# Patient Record
Sex: Male | Born: 1960 | Race: White | Hispanic: No | Marital: Single | Smoking: Current every day smoker
Health system: Southern US, Community
[De-identification: ages and names within clinical notes are randomized; demographics above are authoritative.]

## PROBLEM LIST (undated history)

## (undated) DIAGNOSIS — F3181 Bipolar II disorder: Secondary | ICD-10-CM

## (undated) DIAGNOSIS — IMO0002 Reserved for concepts with insufficient information to code with codable children: Secondary | ICD-10-CM

## (undated) HISTORY — PX: APPENDECTOMY: SHX54

---

## 2002-02-10 ENCOUNTER — Inpatient Hospital Stay (HOSPITAL_COMMUNITY): Admission: EM | Admit: 2002-02-10 | Discharge: 2002-02-12 | Payer: Self-pay | Admitting: *Deleted

## 2003-08-01 ENCOUNTER — Emergency Department (HOSPITAL_COMMUNITY): Admission: EM | Admit: 2003-08-01 | Discharge: 2003-08-01 | Payer: Self-pay | Admitting: Family Medicine

## 2004-10-12 ENCOUNTER — Ambulatory Visit: Payer: Self-pay | Admitting: Internal Medicine

## 2004-10-15 ENCOUNTER — Ambulatory Visit: Payer: Self-pay | Admitting: Internal Medicine

## 2004-10-21 ENCOUNTER — Ambulatory Visit: Payer: Self-pay | Admitting: Internal Medicine

## 2005-01-01 ENCOUNTER — Ambulatory Visit: Payer: Self-pay | Admitting: Internal Medicine

## 2005-01-21 ENCOUNTER — Ambulatory Visit: Payer: Self-pay | Admitting: Internal Medicine

## 2005-04-30 ENCOUNTER — Ambulatory Visit: Payer: Self-pay | Admitting: Internal Medicine

## 2005-05-04 ENCOUNTER — Encounter: Admission: RE | Admit: 2005-05-04 | Discharge: 2005-05-04 | Payer: Self-pay | Admitting: Internal Medicine

## 2005-05-13 ENCOUNTER — Ambulatory Visit: Payer: Self-pay | Admitting: Cardiology

## 2006-03-06 ENCOUNTER — Emergency Department (HOSPITAL_COMMUNITY): Admission: EM | Admit: 2006-03-06 | Discharge: 2006-03-06 | Payer: Self-pay | Admitting: Family Medicine

## 2006-11-24 IMAGING — CR DG CHEST 2V
2 series · 2 of 2 positions shown · non-contrast
Comparison: None.

CLINICAL DATA: Routine physical.  Former smoker. 
 TWO VIEW CHEST:

[w chest pa]
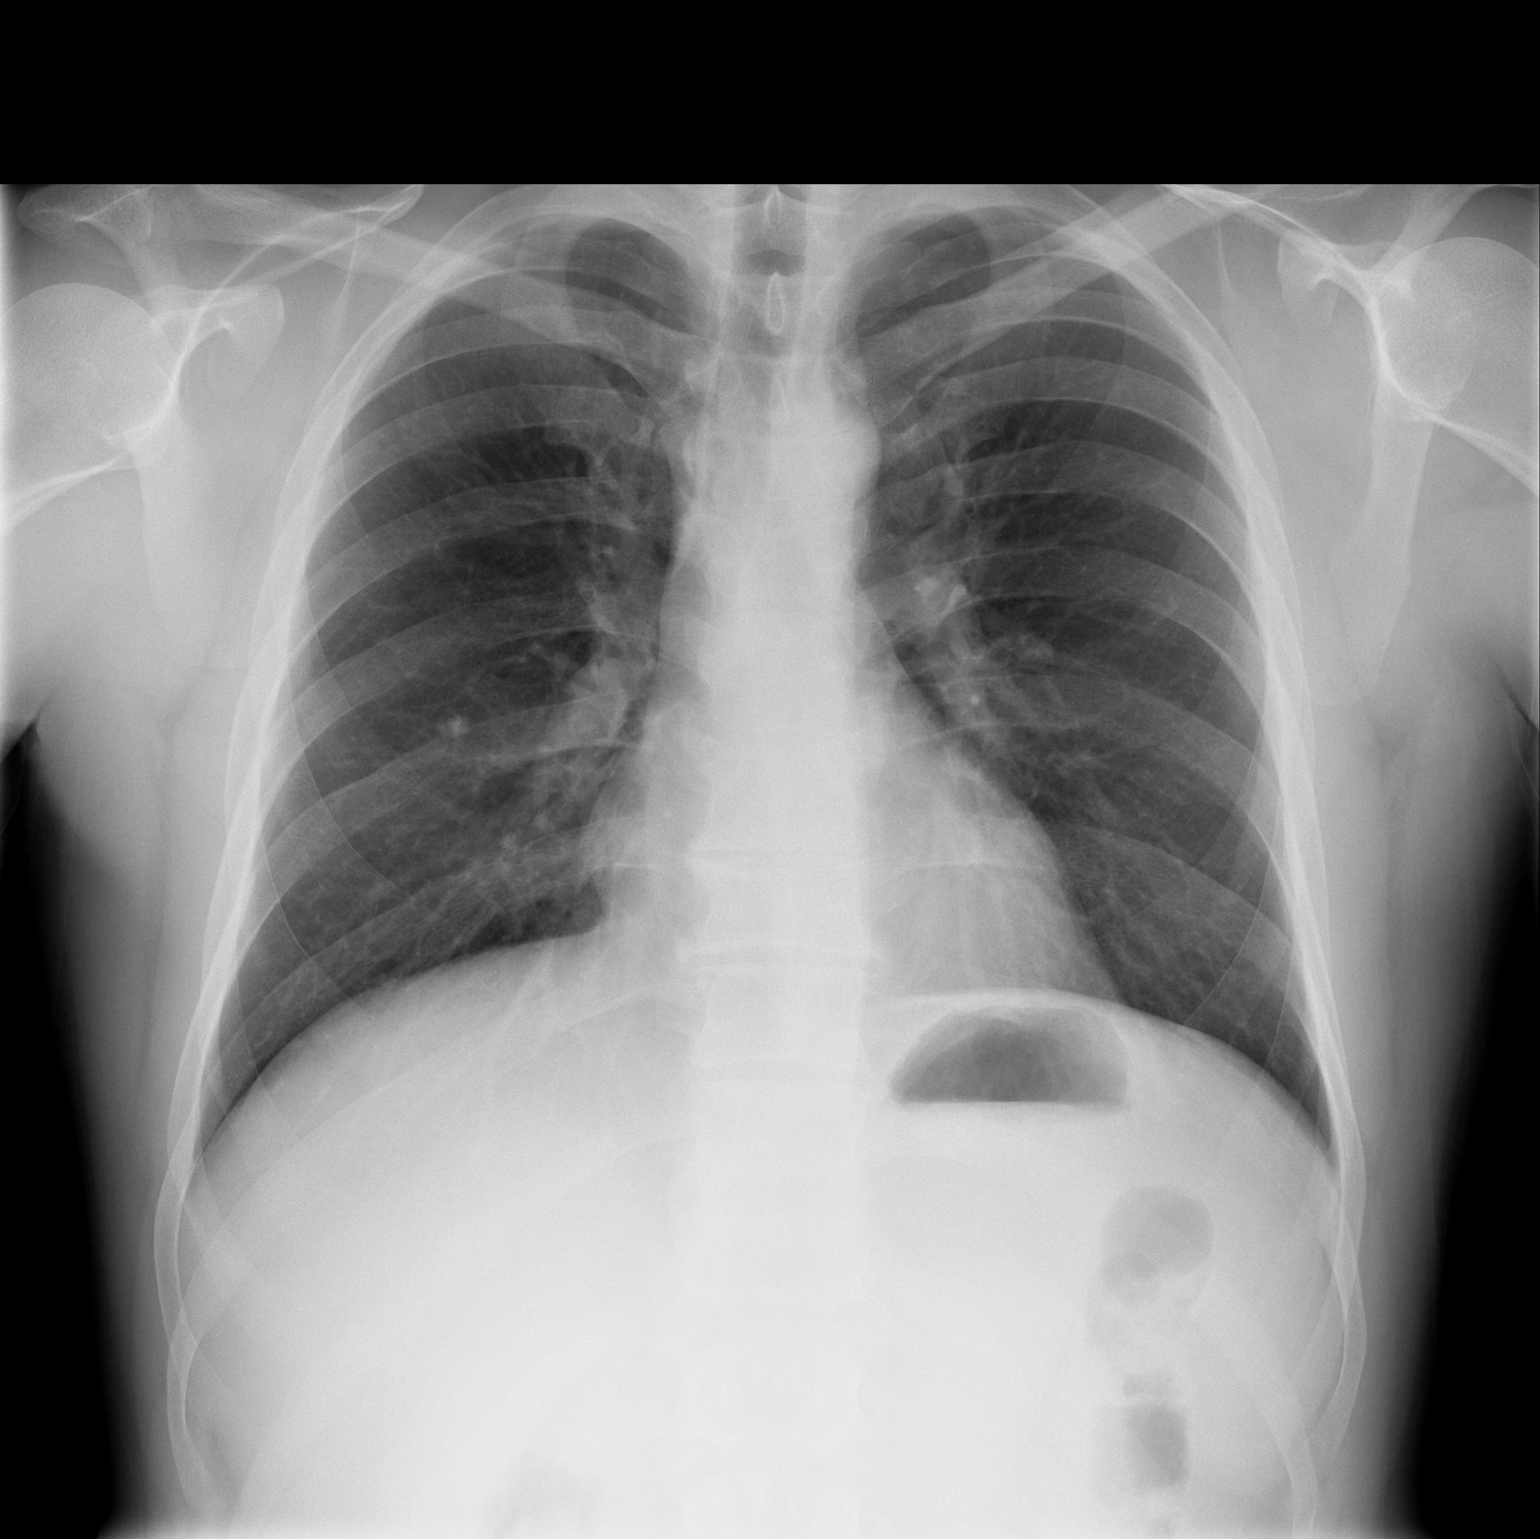

[w chest lat]
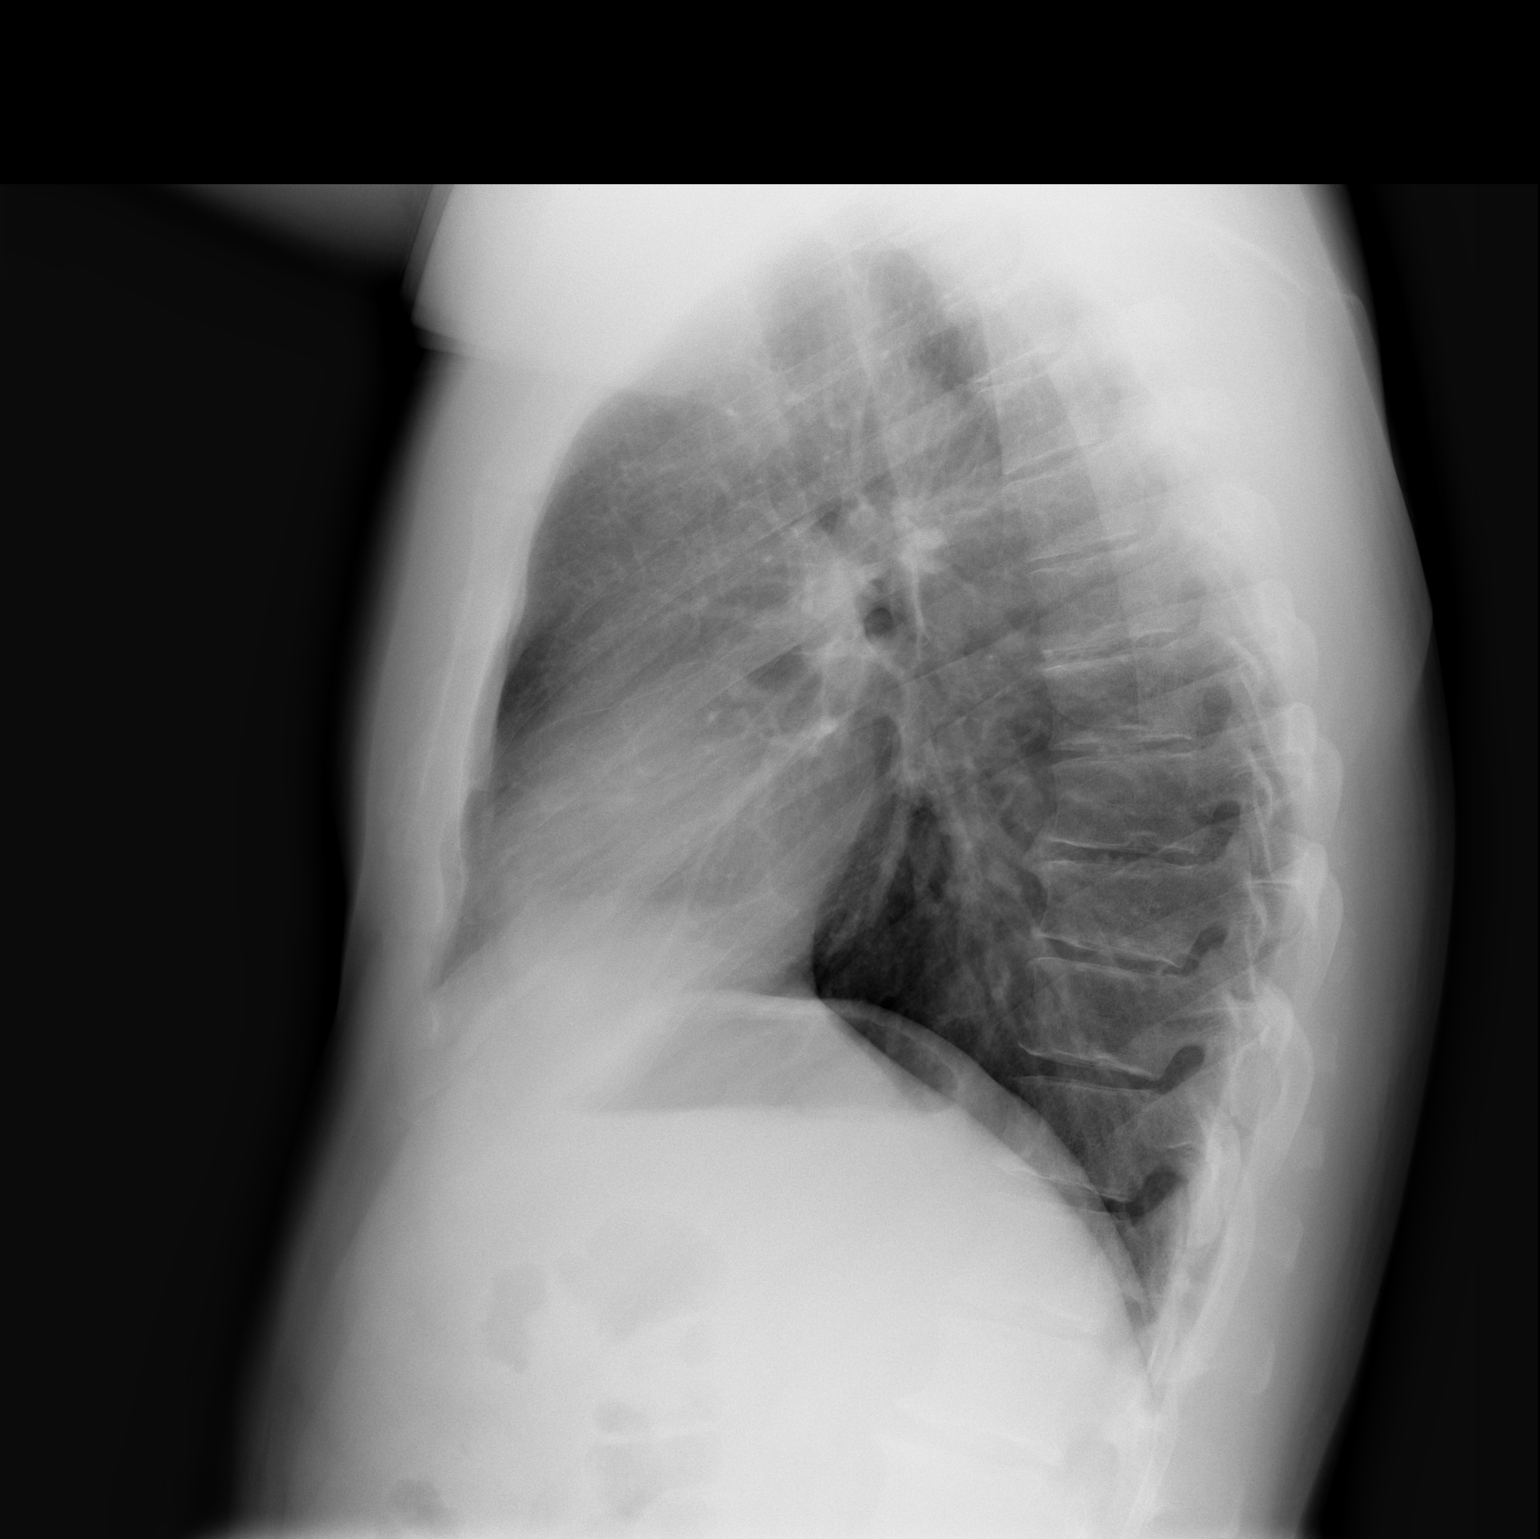

[2 of 2 positions shown; findings below may reference images not displayed]

FINDINGS: 10mm nodule is identified within the right mid lung.  A CT of the chest is recommended for further evaluation.  
 Remaining lungs are clear.  Heart size is normal.  No effusions or edema.
IMPRESSION: Right mid lung nodule.  Recommend CT chest.

## 2006-12-03 IMAGING — CT CT CHEST WO/W CM
2 of 7 series · 12 of 36 positions shown, 15 images · IV contrast (omnipaque)
Comparison: Chest radiograph on 05/04/05.

CLINICAL DATA: Right lung nodule seen on chest radiograph. Smoker. 
 CHEST CT WITHOUT AND WITH CONTRAST:
TECHNIQUE: Multidetector CT imaging of the chest was performed following the standard protocol before and during bolus administration of intravenous contrast.
 Contrast:  80 cc Omnipaque 300.

[Series 2: chest_routine 5.0 b40f st · axial · 0.66mm/px · z∈[-362,-87]mm · 11 of 67 slices shown, 14 images]
[im 6/67  mediastinal]
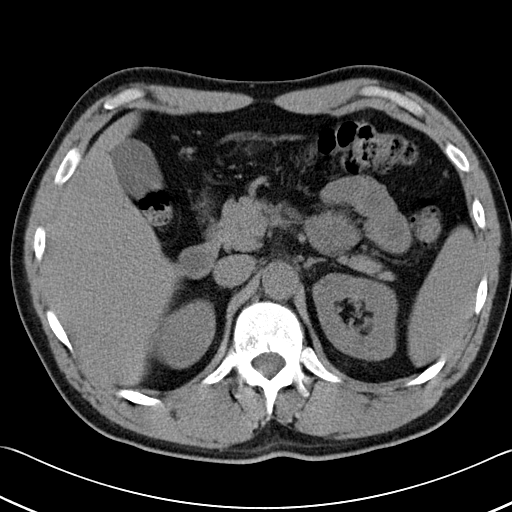
[im 6/67  lung]
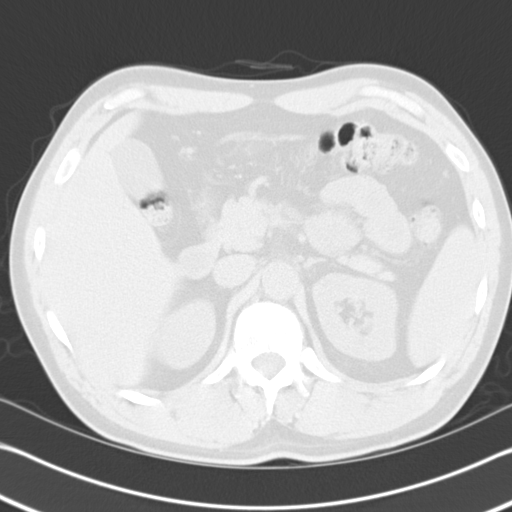
[im 12/67  lung]
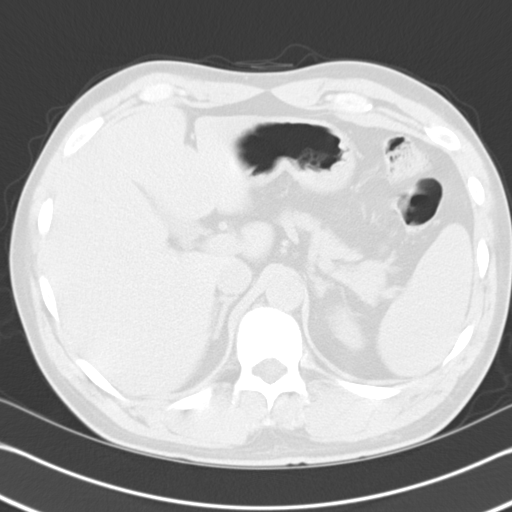
[im 17/67  lung]
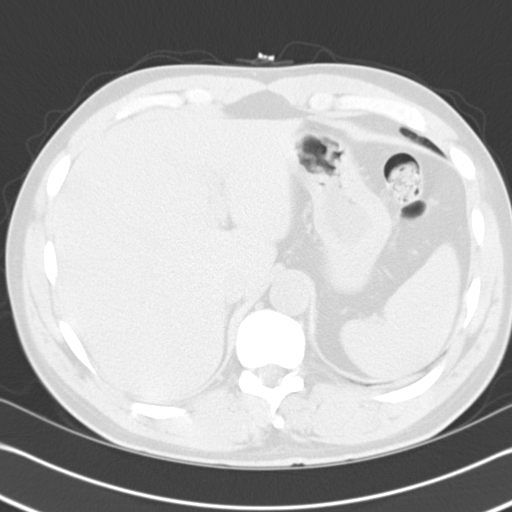
[im 23/67  lung]
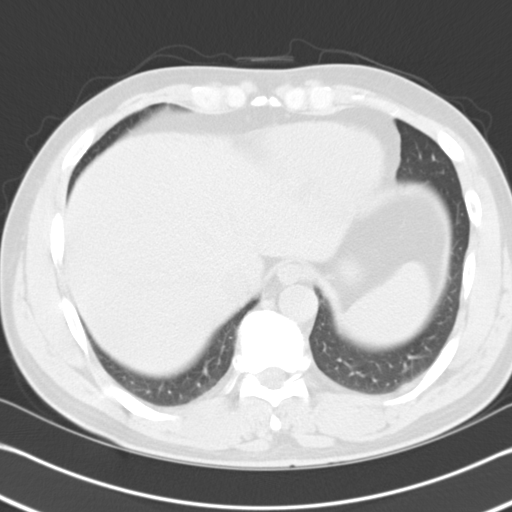
[im 28/67  mediastinal]
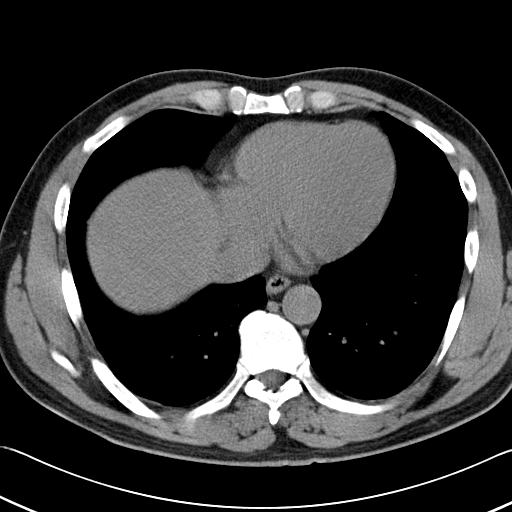
[im 28/67  lung]
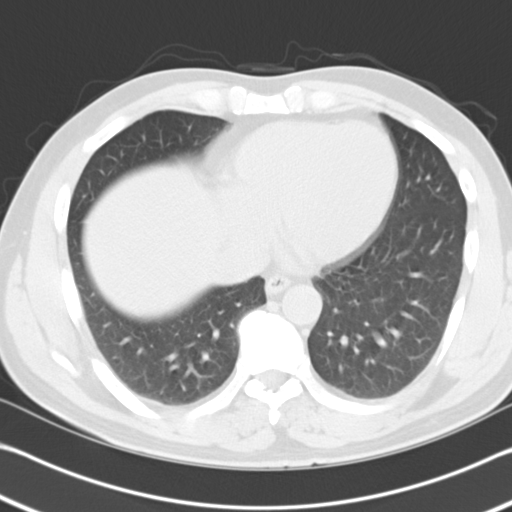
[im 34/67  lung]
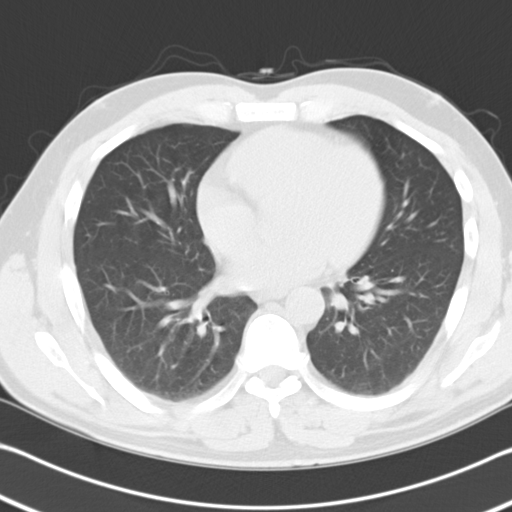
[im 39/67  lung]
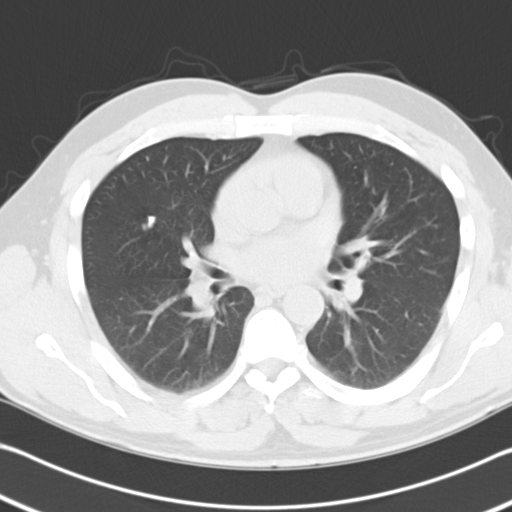
[im 45/67  lung]
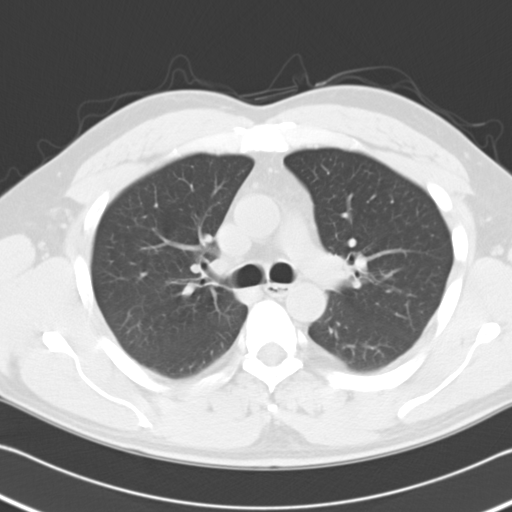
[im 50/67  mediastinal]
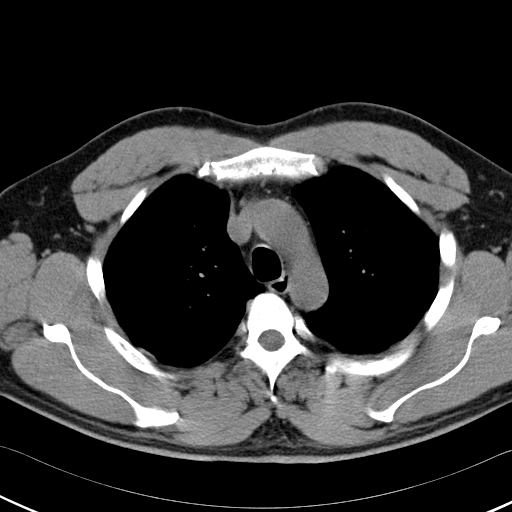
[im 50/67  lung]
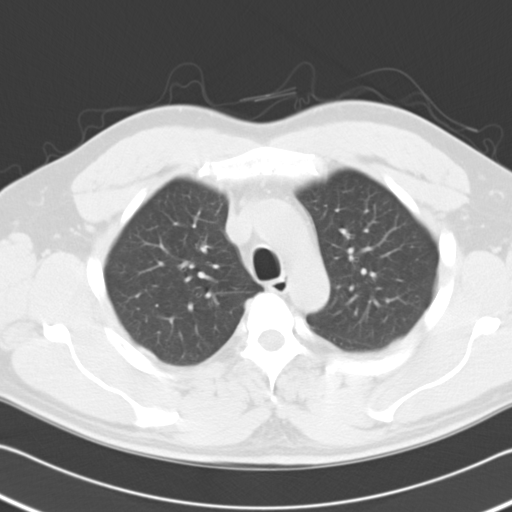
[im 56/67  lung]
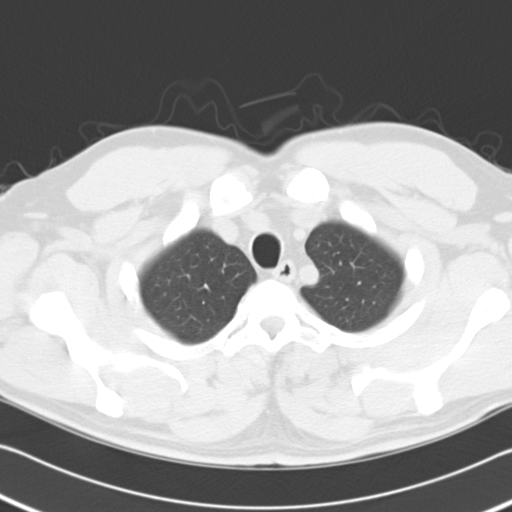
[im 61/67  lung]
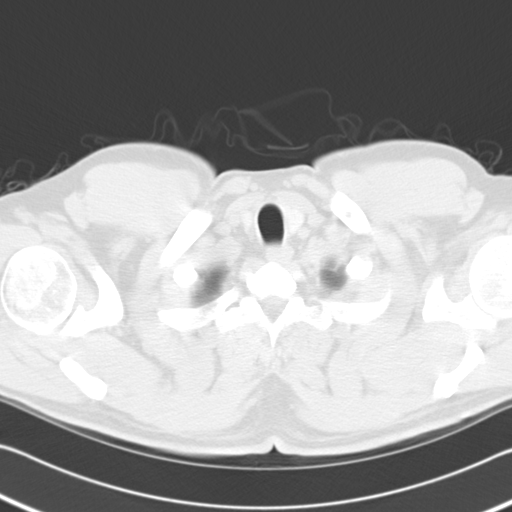

[Series 602: coronal chest · coronal · 0.68mm/px · 1 of 48 slices shown]
[im 24/48  lung]
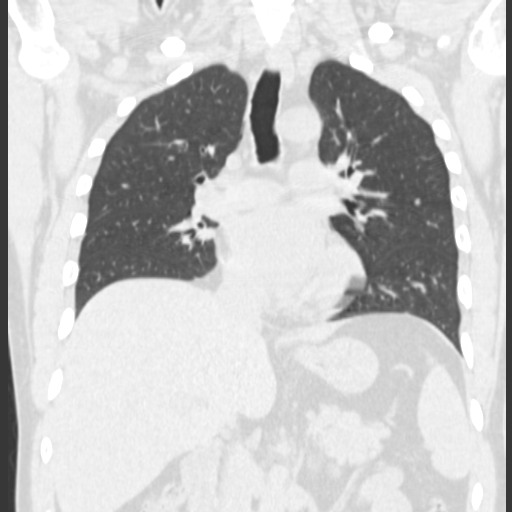

[12 of 36 positions shown; findings below may reference images not displayed]

FINDINGS: A calcified granuloma is seen in the right middle lobe which corresponds to the nodule seen on recent chest radiograph.  No noncalcified pulmonary nodules or masses are identified. There is no evidence of pulmonary infiltrate.  
 There is no evidence of pleural or pericardial effusion. There is no evidence of hilar or mediastinal masses or adenopathy within the thorax.  Images obtained through the upper abdomen to the level of the adrenal glands are unremarkable.
IMPRESSION: 1.  Calcified granuloma in the right middle lobe, corresponding with the nodular opacity seen on chest radiograph. 
 2.  No evidence of mass or other active disease.

## 2014-06-15 ENCOUNTER — Encounter (HOSPITAL_COMMUNITY): Payer: Self-pay | Admitting: *Deleted

## 2014-06-15 ENCOUNTER — Emergency Department (HOSPITAL_COMMUNITY)
Admission: EM | Admit: 2014-06-15 | Discharge: 2014-06-15 | Disposition: A | Discharge status: Home / Self Care | Payer: Self-pay | Attending: Emergency Medicine | Admitting: Emergency Medicine

## 2014-06-15 DIAGNOSIS — Z8669 Personal history of other diseases of the nervous system and sense organs: Secondary | ICD-10-CM | POA: Insufficient documentation

## 2014-06-15 DIAGNOSIS — Z720 Tobacco use: Secondary | ICD-10-CM | POA: Insufficient documentation

## 2014-06-15 DIAGNOSIS — Z8659 Personal history of other mental and behavioral disorders: Secondary | ICD-10-CM | POA: Insufficient documentation

## 2014-06-15 DIAGNOSIS — F419 Anxiety disorder, unspecified: Secondary | ICD-10-CM | POA: Insufficient documentation

## 2014-06-15 HISTORY — DX: Reserved for concepts with insufficient information to code with codable children: IMO0002

## 2014-06-15 NOTE — ED Notes (Signed)
The pt is bi-polar and for 7 months he has been out of his meds.  His doctor retired

## 2014-06-15 NOTE — ED Provider Notes (Signed)
CSN: [id number]     Arrival date & time 06/15/14  1642 History  This chart was scribed for non-physician practitioner, Will Marijo Fileansie, PA-C working with Purvis SheffieldForrest Harrison, MD by Angelene GiovanniEmmanuella Mensah, ED Scribe. The patient was seen in room TR05C/TR05C and the patient's care was started at 7:10 PM    Chief Complaint  Patient presents with  . Medication Refill   The history is provided by the patient. No language interpreter was used.   HPI Comments: Donald HammersRandal Gross is a 54 y.o. male who presents to the Emergency Department for medication refill. He reports that he has been off the medication for 9 months and last night, he had an outburst and would like to be placed back on them. He reports yesterday he yelled at a friend when he got upset. He denies any SI or HI at that time. He reports that he was seeing Dr. Delanna AhmadiAlex Meyers for his psych needs but he has since retired. He reports taking Risperidone 2 mg and Lamotrigine 100 mg. He denies HI/SI. He reports anxiety especially about his outburst yesterday. He denies paranoia or hallucinations. He reports that he has been sleeping well. He denies any fever, chills, cough, CP, abdominal pain, N/V/D, sore throat, or nasal congestion.   Past Medical History  Diagnosis Date  . Bilateral posterior subcapsular polar cataract    History reviewed. No pertinent past surgical history. No family history on file. History  Substance Use Topics  . Smoking status: Current Every Day Smoker  . Smokeless tobacco: Not on file  . Alcohol Use: Yes    Review of Systems  Constitutional: Negative for fever and chills.  HENT: Negative for congestion and sore throat.   Respiratory: Negative for cough.   Cardiovascular: Negative for chest pain.  Gastrointestinal: Negative for nausea, vomiting, abdominal pain and diarrhea.  Skin: Negative for rash.  Psychiatric/Behavioral: Negative for suicidal ideas, hallucinations, confusion, sleep disturbance, dysphoric mood and agitation. The  patient is nervous/anxious. The patient is not hyperactive.       Allergies  Review of patient's allergies indicates no known allergies.  Home Medications   Prior to Admission medications   Not on File   BP 165/85 mmHg  Pulse 84  Temp(Src) 98.2 F (36.8 C)  Resp 20  Ht 5' 11.5" (1.816 m)  Wt 191 lb (86.637 kg)  BMI 26.27 kg/m2  SpO2 95% Physical Exam  Constitutional: He is oriented to person, place, and time. He appears well-developed and well-nourished. No distress.  Nontoxic appearing. Good eye contact while speaking.   HENT:  Head: Normocephalic and atraumatic.  Mouth/Throat: No oropharyngeal exudate.  Eyes: Conjunctivae are normal. Right eye exhibits no discharge. Left eye exhibits no discharge.  Neck: Neck supple. No tracheal deviation present.  Cardiovascular: Normal rate, regular rhythm, normal heart sounds and intact distal pulses.   Pulmonary/Chest: Effort normal and breath sounds normal. No respiratory distress. He has no wheezes. He has no rales.  Abdominal: Soft. There is no tenderness.  Musculoskeletal: Normal range of motion.  Lymphadenopathy:    He has no cervical adenopathy.  Neurological: He is alert and oriented to person, place, and time. Coordination normal.  Skin: Skin is warm and dry. No rash noted. He is not diaphoretic. No erythema. No pallor.  Psychiatric: His behavior is normal. Thought content normal. His mood appears anxious. His affect is not angry, not blunt, not labile and not inappropriate. His speech is not rapid and/or pressured, not tangential and not slurred. He is not agitated,  not aggressive, not hyperactive and not actively hallucinating. Thought content is not paranoid and not delusional. Cognition and memory are not impaired. He does not exhibit a depressed mood. He expresses no homicidal and no suicidal ideation.  Patient appears slightly anxious. He denies suicidal or homicidal ideations. He reports he has been sleeping well. He denies  paranoia. He makes good eye contact while speaking. He is alert and oriented 3. He is attentive.  Nursing note and vitals reviewed.   ED Course  Procedures (including critical care time) DIAGNOSTIC STUDIES: Oxygen Saturation is 96% on RA, adequate by my interpretation.    COORDINATION OF CARE: 7:17 PM- Pt advised of plan for treatment and pt agrees.    Labs Review Labs Reviewed - No data to display  Imaging Review No results found.   EKG Interpretation None      Filed Vitals:   06/15/14 1659 06/15/14 1923  BP: 170/91 165/85  Pulse: 91 84  Temp: 98.2 F (36.8 C)   Resp: 16 20  Height: 5' 11.5" (1.816 m)   Weight: 191 lb (86.637 kg)   SpO2: 96% 95%     MDM   Meds given in ED:  Medications - No data to display  New Prescriptions   No medications on file    Final diagnoses:  History of bipolar disorder   This is a 54 year old male with a history of bipolar disorder who presents to the emergency department requesting to be restarted on his bipolar medications. The patient has not taken any medications in the past 9 months. His former psychiatrist Dr. Delanna Ahmadi has retired. The patient is afebrile and nontoxic appearing. He makes good eye contact while speaking. He denies suicidal or homicidal ideations. He reports he's been sleeping well. He is alert and oriented 3. As the patient has been off his medications for the past 9 months he needs to be restarted on these medications by a psychiatrist. I will provide him with the appropriate information for him to follow-up with a psychiatrist in the area. He seems reasonable and willing to follow-up. He does not appear to be manic and he denies suicidal or homicidal ideations. Strict return precautions provided. I advised the patient to return to the emergency department with new or worsening symptoms or new concerns. The patient verbalized understanding and agreement with plan.   I personally performed the services  described in this documentation, which was scribed in my presence. The recorded information has been reviewed and is accurate.    Everlene Farrier, PA-C 06/15/14 1928  Purvis Sheffield, MD 06/16/14 [phone]

## 2014-06-15 NOTE — Discharge Instructions (Signed)
Substance Abuse Treatment Programs ° °Intensive Outpatient Programs °[facility]     °[address]      °[place], [place]                   °[phone]      ° °[facility] °[address] °[place], [place] °[phone] ° °[facility]     °(Inpatient and outpatient)     °[address]           °[phone]   ° °[facility] °[phone] (Suboxone and Methadone) ° °[address]      °[place], [place] [address]      °[phone]      ° °[address] °[place], [place] °[phone] ° °[facility] (Outpatient/Inpatient, [facility])    °(insurance only) [phone]      °       °[facility] (Groups & Residential) °[place], [place] °[phone] ° °   °[facility]     °[address]     °[place], [place]      °[phone]      ° °[facility] (for caregivers and family) °[address] °[place], [place] °[phone] ° ° ° ° ° °Residential Treatment Programs °[facility]      °[address], [place], [place] [address]  °[phone]      ° °[facility] °[address] [place], [place] [address] °[phone] ° °[facility]        °[phone]      ° °[facility] °[phone] ° °[facility]             °[address]                                         °[place], [place]                                                °[phone] or [phone]                              ° °[facility] [place] °[address] °[place], [address] °[phone] ° °[facility]    °[address]      °, [place]     °[phone]      ° °[facility] °[address] °, [place] °[phone] ° °[facility]   °[address]     °[place], [place] [address]     °[phone]      °Admissions: 8am-3pm M-F ° °[facility] °[address] °[place],  [place] °[phone] ° °[facility]: Residential Program (90 Days)   °[place], [place]      °[phone] or [phone]    ° °[facility] °[place], [place] °(Walk in Hours over the weekend or by referral) ° °[facility] °[address], [place], [place] [address] °[phone] ° °Crisis Mobile: [facility]:  1-877-626-1772 (for crisis response 24 hours a day) °[facility] Hotline:      1-800-256-2452 °Outpatient Psychiatry and Counseling ° °[facility]: Mobile Crisis   Management 24 hours:  1-579-867-5796  [facility] of the [employer] sliding scale fee and walk in schedule: M-F 8am-12pm/1pm-3pm [address]  [place], [place] [address] [place], [place] [address] [phone]  [facility] ([facility])- new patient walk-in appointments available Monday - Friday 8am -3pm.          [address] [place], [place] [address] [phone] or crisis line- [facility]/ Intensive Outpatient Therapy Program [place], [place] [address] [place]      [phone] [place] [place], [place] [address]                 [place]   [facility] [phone] [place], [place] [address]   [facility]          [address] Johnette Abraham  [place], [place] [address]       [phone]  [facility] [address] [place], [place] [address] [phone]  [facility]    [address] [place], [place] [address]     [place], [place]celyn [place]     [place] [address]     [phone]       [facility] [place] [address]  [facility]     [address], [place], [place], [place] [address] [place]     [address]     [place], [place] [address]     [phone]       [facility] [address] [place], [place] [address] [phone] Resources for Temporary Residential Assistance/Crisis [facility]) M-F 8am-3pm   [address] [place], [place] [address]   [phone] Services include: laundry, barbering, support groups, case management, phone  & computer access, showers, AA/NA mtgs, mental health/substance abuse nurse, job skills class, disability information, VA assistance, spiritual classes, etc.   [facility]   [address], [place]     [place]              [employer] (women and children)       [place]. [place], [place] [address] [phone] [email or web address] for application and process Application Required  [facility]   [address]    [place] [address]     [phone]                    [place],  [address] [phone](OILNZVJK application appt.) Application Required  [facility] (women only)    [address]     [place],  [address]     [phone]  Intake starts 6pm daily Need valid ID, SSC, & Police report [employer] [address] [place], [place] [phone] Application Required  [facility] (men only)     [address]      [place], [place]     [phone]       [facility] (Pregnant women only) [address] [place], [place] [phone]  [facility]      [address]      [place], [place] [address]     [phone]             [facility] [address] [place], [place] [phone] 90 day commitment/SA/Application process  [facility](men only)     [address]     [place], [place]     [phone]       Check-in at [facility] [address] [place], [place] [address] [phone] Men/Women/Women and Children must be there by 7 pm  [facility] [place], [place] [phone]                  Please return to the emergency department with new or worsening symptoms or thoughts of wanting to hurt yourself, other people or new concerns.  Bipolar Disorder Bipolar disorder is a mental illness. The term bipolar disorder actually is used to describe a group of disorders that all share varying degrees of emotional highs and lows that can interfere with daily functioning, such as work, school, or relationships. Bipolar disorder also can lead to drug abuse, hospitalization, and suicide. The emotional highs of bipolar disorder are periods of elation or irritability and high energy. These highs can range from a mild form (hypomania) to a severe form (mania). People experiencing episodes of hypomania may appear energetic, excitable, and highly productive. People experiencing mania may behave impulsively or erratically. They often make poor decisions. They may have difficulty sleeping. The most severe episodes of mania can involve having very distorted beliefs or perceptions about the world and seeing or hearing things that are not real (psychotic delusions and hallucinations).  The emotional lows of bipolar disorder (depression) also can range from mild to severe. Severe episodes of bipolar depression can involve psychotic delusions and hallucinations. Sometimes people with bipolar disorder experience a state of mixed mood. Symptoms of hypomania or mania and  depression are both present during this mixed-mood episode. SIGNS AND SYMPTOMS There are signs and symptoms of the episodes of hypomania and mania as well as the episodes of depression. The signs and symptoms of hypomania and mania are similar but vary in severity. They include:  Inflated self-esteem or feeling of increased self-confidence.  Decreased need for sleep.  Unusual talkativeness (rapid or pressured speech) or the feeling of a need to keep talking.  Sensation of racing thoughts or constant talking, with quick shifts between topics that may or may not be related (flight of ideas).  Decreased ability to focus or concentrate.  Increased purposeful activity, such as work, studies, or social activity, or nonproductive activity, such as pacing, squirming and fidgeting, or finger and toe tapping.  Impulsive behavior and use of poor judgment, resulting in high-risk activities, such as having unprotected sex or spending excessive amounts of money. Signs and symptoms of depression include the following:   Feelings of sadness, hopelessness, or helplessness.  Frequent or  uncontrollable episodes of crying.  Lack of feeling anything or caring about anything.  Difficulty sleeping or sleeping too much.  Inability to enjoy the things you used to enjoy.   Desire to be alone all the time.   Feelings of guilt or worthlessness.  Lack of energy or motivation.   Difficulty concentrating, remembering, or making decisions.  Change in appetite or weight beyond normal fluctuations.  Thoughts of death or the desire to harm yourself. DIAGNOSIS  Bipolar disorder is diagnosed through an assessment by your caregiver. Your caregiver will ask questions about your emotional episodes. There are two main types of bipolar disorder. People with type I bipolar disorder have manic episodes with or without depressive episodes. People with type II bipolar disorder have hypomanic episodes and major  depressive episodes, which are more serious than mild depression. The type of bipolar disorder you have can make an important difference in how your illness is monitored and treated. Your caregiver may ask questions about your medical history and use of alcohol or drugs, including prescription medication. Certain medical conditions and substances also can cause emotional highs and lows that resemble bipolar disorder (secondary bipolar disorder).  TREATMENT  Bipolar disorder is a long-term illness. It is best controlled with continuous treatment rather than treatment only when symptoms occur. The following treatments can be prescribed for bipolar disorders:  Medication--Medication can be prescribed by a doctor that is an expert in treating mental disorders (psychiatrists). Medications called mood stabilizers are usually prescribed to help control the illness. Other medications are sometimes added if symptoms of mania, depression, or psychotic delusions and hallucinations occur despite the use of a mood stabilizer.  Talk therapy--Some forms of talk therapy are helpful in providing support, education, and guidance. A combination of medication and talk therapy is best for managing the disorder over time. A procedure in which electricity is applied to your brain through your scalp (electroconvulsive therapy) is used in cases of severe mania when medication and talk therapy do not work or work too slowly. Document Released: 05/31/2000 Document Revised: 06/19/2012 Document Reviewed: 03/20/2012 [facility] Patient Information 2015 [employer] [place]. This information is not intended to replace advice given to you by your health care provider. Make sure you discuss any questions you have with your health care provider.

## 2014-06-15 NOTE — ED Notes (Addendum)
Pt stopped taking Bipolar meds  9 months ago, his PCP retired.  Pt needs to re-start meds:  Risperidone  Mg and Lamotrigime 100 mg.  Pt reports had "blow out" with friend last night and needs meds.

## 2019-08-08 ENCOUNTER — Encounter (HOSPITAL_COMMUNITY): Payer: Self-pay | Admitting: Psychiatric/Mental Health

## 2019-08-08 ENCOUNTER — Telehealth (INDEPENDENT_AMBULATORY_CARE_PROVIDER_SITE_OTHER): Payer: No Payment, Other | Admitting: Psychiatric/Mental Health

## 2019-08-08 DIAGNOSIS — F319 Bipolar disorder, unspecified: Secondary | ICD-10-CM | POA: Diagnosis not present

## 2019-08-08 MED ORDER — RISPERIDONE 2 MG PO TABS: 2.0000 mg | ORAL_TABLET | Freq: Every day | ORAL | 3 refills | Status: DC

## 2019-08-08 NOTE — Progress Notes (Signed)
.  bhin

## 2019-08-08 NOTE — Patient Instructions (Signed)
Patient is to continue with prescribed medications to manage symptoms of bipolar disorder.Patient is to follow-up in 12 weeks for med management appt.  Risperidone tablets What is this medicine? RISPERIDONE (ris PER i done) is an antipsychotic. It is used to treat schizophrenia, bipolar disorder, and some symptoms of autism. This medicine may be used for other purposes; ask your health care provider or pharmacist if you have questions. COMMON BRAND NAME(S): Risperdal What should I tell my health care provider before I take this medicine? They need to know if you have any of these conditions:  dehydration  dementia  diabetes  difficulty swallowing  heart disease  history of breast cancer  history of stroke  irregular heartbeat  kidney disease  liver disease  low blood counts, like low white cell, platelet, or red cell counts  low blood pressure  Parkinson's disease  seizures  an unusual or allergic reaction to risperidone, paliperidone, other medicines, foods, dyes, or preservatives  pregnant or trying to get pregnant  breast-feeding How should I use this medicine? Take this medicine by mouth with a glass of water. Follow the directions on the prescription label. You can take it with or without food. If it upsets your stomach, take it with food. Take your medicine at regular intervals. Do not take it more often than directed. Do not stop taking except on your doctor's advice. Talk to your pediatrician regarding the use of this medicine in children. While this drug may be prescribed for children as young as 66 years of age for selected conditions, precautions do apply. Overdosage: If you think you have taken too much of this medicine contact a poison control center or emergency room at once. NOTE: This medicine is only for you. Do not share this medicine with others. What if I miss a dose? If you miss a dose, take it as soon as you can. If it is almost time for your  next dose, take only that dose. Do not take double or extra doses. What may interact with this medicine? Do not take this medicine with any of the following medications:  cisapride  dextromethorphan; quinidine  dronedarone  metoclopramide  pimozide  quinidine  thioridazine This medicine may also interact with the following medications:  alcohol  certain medicines for anxiety or sleep  certain medicines for blood pressure  certain medicines for fungal infections like fluconazole, itraconazole, ketoconazole, posaconazole. and voriconazole  certain medicines for seizures like carbamazepine, phenobarbital, and phenytoin  fluoxetine  levodopa  other medicines that prolong the QT interval (cause an abnormal heart rhythm)  paroxetine  rifampin This list may not describe all possible interactions. Give your health care provider a list of all the medicines, herbs, non-prescription drugs, or dietary supplements you use. Also tell them if you smoke, drink alcohol, or use illegal drugs. Some items may interact with your medicine. What should I watch for while using this medicine? Visit your health care professional for regular checks on your progress. Tell your health care professional if symptoms do not start to get better or if they get worse. Do not stop taking except on your health care professional's advice. You may develop a severe reaction. Your health care professional will tell you how much medicine to take. You may get dizzy or drowsy. Do not drive, use machinery, or do anything that needs mental alertness until you know how this medicine affects you. Do not stand or sit up quickly, especially if you are an older patient. This reduces  the risk of dizzy or fainting spells. Alcohol may interfere with the effect of this medicine. Avoid alcoholic drinks. This drug can cause problems with controlling your body temperature. It can lower the response of your body to cold  temperatures. If possible, stay indoors during cold weather. If you must go outdoors, wear warm clothes. It can also lower the response of your body to heat. Do not overheat. Do not over-exercise. Stay out of the sun when possible. If you must be in the sun, wear cool clothing. Drink plenty of water. If you have trouble controlling your body temperature, call your health care provider right away. This medicine may increase blood sugar. Ask your health care provider if changes in diet or medicines are needed if you have diabetes. What side effects may I notice from receiving this medicine? Side effects that you should report to your doctor or health care professional as soon as possible:  allergic reactions like skin rash, itching or hives, swelling of the face, lips, or tongue  breast enlargement in both males and females  breathing problems  confusion  fast, irregular heartbeat  fever or chills, sore throat  inability to keep still  males: prolonged or painful erection  missed menstrual periods  problems with balance, talking, walking  redness, blistering, peeling, or loosening of the skin, including inside the mouth  seizures  signs and symptoms of high blood sugar such as being more thirsty or hungry or having to urinate more than normal. You may also feel very tired or have blurry vision  signs and symptoms of low blood pressure like dizziness; feeling faint or lightheaded, falls; unusually weak or tired  signs and symptoms of neuroleptic malignant syndrome like confusion; fast or irregular heartbeat; high fever; increased sweating; stiff muscles  sudden numbness or weakness of the face, arm, or leg  suicidal thoughts or other mood changes  trouble swallowing  uncontrollable movements of the arms, face, head, mouth, neck, or upper body Side effects that usually do not require medical attention (report to your doctor or health care professional if they continue or are  bothersome):  changes in sex drive or performance  constipation  drowsiness  dry mouth  upset stomach  weight gain This list may not describe all possible side effects. Call your doctor for medical advice about side effects. You may report side effects to FDA at 1-800-FDA-1088. Where should I keep my medicine? Keep out of the reach of children. Store at room temperature between 15 and 25 degrees C (59 and 77 degrees F). Protect from light. Throw away any unused medicine after the expiration date. NOTE: This sheet is a summary. It may not cover all possible information. If you have questions about this medicine, talk to your doctor, pharmacist, or health care provider.  2020 Elsevier/Gold Standard (2018-12-20 12:06:35)

## 2019-08-08 NOTE — Progress Notes (Addendum)
Psychiatric Initial Adult Assessment  Virtual Visit via Video Note  I connected with Donald Gross on 08/08/19 at  8:00 AM EDT by a video enabled telemedicine application and verified that I am speaking with the correct person using two identifiers.  Location: Patient: Home Provider: [facility] Office  I discussed the limitations of evaluation and management by telemedicine and the availability of in person appointments. The patient expressed understanding and agreed to proceed. I provided 45 minutes of non-face-to-face time during this encounter.  Patient Identification: Donald Gross MRN:  [id number] Date of Evaluation:  08/08/2019 Referral Source: Vesta Mixer  Chief Complaint:   Visit Diagnosis:    ICD-10-CM   1. Bipolar I disorder (HCC)  F31.9     History of Present Illness:  59 y.o., male presented to [facility] via Telemedicine for routine medication follow-up.  On assessment pt states that he feels "fine".  He is currently prescribed 200mg  of lamictal daily and 2mg  of Risperdal to be taken at bedtime.  Medications will be continuedfor patient stabilization. Patient reports good sleep and good appetite.  Patient is alert/oriented, calm/cooperative.  Patient denies suicidal/homicidal ideation, thoughts of self injury, psychosis, and paranoia.  Patient reports no side effects from the medication.     Associated Signs/Symptoms: Depression Symptoms:  NA (Hypo) Manic Symptoms:  NA Anxiety Symptoms:  NA Psychotic Symptoms:  NA PTSD Symptoms: NA  Past Psychiatric History: Bipolar  Previous Psychotropic Medications: Yes   Substance Abuse History in the last 12 months:  No.  Consequences of Substance Abuse: NA  Past Medical History:  Past Medical History:  Diagnosis Date  . Bilateral posterior subcapsular polar cataract    No past surgical history on file.  Family Psychiatric History: Unknown  Family History: No family history on file.  Social History:   Social History    Socioeconomic History  . Marital status: Single    Spouse name: Not on file  . Number of children: Not on file  . Years of education: Not on file  . Highest education level: Not on file  Occupational History  . Not on file  Tobacco Use  . Smoking status: Current Every Day Smoker  Substance and Sexual Activity  . Alcohol use: Yes  . Drug use: Not on file  . Sexual activity: Not on file  Other Topics Concern  . Not on file  Social History Narrative  . Not on file   Social Determinants of Health   Financial Resource Strain:   . Difficulty of Paying Living Expenses:   Food Insecurity:   . Worried About in the Last Year:   . in the Last Year:   Transportation Needs:   . Programme researcher, broadcasting/film/video (Medical):   Barista Lack of Transportation (Non-Medical):   Physical Activity:   . Days of Exercise per Week:   . Minutes of Exercise per Session:   Stress:   . Feeling of Stress :   Social Connections:   . Frequency of Communication with Friends and Family:   . Frequency of Social Gatherings with Friends and Family:   . Attends Religious Services:   . Active Member of Clubs or Organizations:   . Attends Freight forwarder Meetings:   Marland Kitchen Marital Status:     Additional Social History: Unknown  Allergies:  No Known Allergies  Metabolic Disorder Labs: No results found for: HGBA1C, MPG No results found for: PROLACTIN No results found for: CHOL, TRIG, HDL, CHOLHDL, VLDL,  [place] No results found for: TSH  Therapeutic Level Labs: No results found for: LITHIUM No results found for: CBMZ No results found for: VALPROATE  Current Medications: Current Outpatient Medications  Medication Sig Dispense Refill  . lamoTRIgine (LAMICTAL) 100 MG tablet Take 100 mg by mouth 2 (two) times daily at 10 AM and 5 PM.    . risperiDONE (RISPERDAL) 2 MG tablet Take 1 tablet (2 mg total) by mouth at bedtime. 1 tablet 3   No current facility-administered  medications for this visit.    Musculoskeletal: Strength & Muscle Tone: within normal limits Gait & Station: normal Patient leans: N/A  Psychiatric Specialty Exam: Review of Systems  All other systems reviewed and are negative.   There were no vitals taken for this visit.There is no height or weight on file to calculate BMI.  General Appearance: Casual  Eye Contact:  Good  Speech:  Clear and Coherent  Volume:  Normal  Mood:  NA  Affect:  Appropriate  Thought Process:  Coherent and Descriptions of Associations: Intact  Orientation:  Full (Time, Place, and Person)  Thought Content:  WDL  Suicidal Thoughts:  No  Homicidal Thoughts:  No  Memory:  Immediate;   Good  Judgement:  Fair  Insight:  Fair  Psychomotor Activity:  Normal  Concentration:  Concentration: Good  Recall:  [place] of Knowledge:Good  Language: Fair  Akathisia:  Yes  Handed:  Right  AIMS (if indicated):    Assets:  Desire for Improvement  ADL's:  Intact  Cognition: WNL  Sleep:  Fair   Screenings:   Assessment and Plan: Patient will continue to take medication as prescribed for Bipolar disorder and follow-up in 12 weeks.  Deloria Lair, NP 6/2/20215:00 PM

## 2019-11-07 ENCOUNTER — Telehealth (HOSPITAL_COMMUNITY): Payer: No Payment, Other

## 2019-11-07 ENCOUNTER — Other Ambulatory Visit: Payer: Self-pay

## 2019-12-06 ENCOUNTER — Other Ambulatory Visit: Payer: Self-pay

## 2019-12-06 ENCOUNTER — Encounter (HOSPITAL_COMMUNITY): Payer: Self-pay | Admitting: *Deleted

## 2019-12-06 ENCOUNTER — Ambulatory Visit (HOSPITAL_COMMUNITY)
Admission: EM | Admit: 2019-12-06 | Discharge: 2019-12-06 | Disposition: A | Discharge status: Home / Self Care | Payer: HRSA Program | Attending: Internal Medicine | Admitting: Internal Medicine

## 2019-12-06 DIAGNOSIS — J069 Acute upper respiratory infection, unspecified: Secondary | ICD-10-CM | POA: Diagnosis not present

## 2019-12-06 DIAGNOSIS — F17210 Nicotine dependence, cigarettes, uncomplicated: Secondary | ICD-10-CM | POA: Insufficient documentation

## 2019-12-06 DIAGNOSIS — Z79899 Other long term (current) drug therapy: Secondary | ICD-10-CM | POA: Insufficient documentation

## 2019-12-06 DIAGNOSIS — Z20822 Contact with and (suspected) exposure to covid-19: Secondary | ICD-10-CM | POA: Diagnosis not present

## 2019-12-06 DIAGNOSIS — R509 Fever, unspecified: Secondary | ICD-10-CM | POA: Insufficient documentation

## 2019-12-06 DIAGNOSIS — F3181 Bipolar II disorder: Secondary | ICD-10-CM | POA: Insufficient documentation

## 2019-12-06 DIAGNOSIS — R05 Cough: Secondary | ICD-10-CM | POA: Diagnosis not present

## 2019-12-06 HISTORY — DX: Bipolar II disorder: F31.81

## 2019-12-06 LAB — SARS CORONAVIRUS 2 (TAT 6-24 HRS): SARS Coronavirus 2: NEGATIVE

## 2019-12-06 NOTE — ED Provider Notes (Signed)
[facility]    CSN: [id number] Arrival date & time: 12/06/19  5631      History   Chief Complaint Chief Complaint  Patient presents with  . Headache  . Fever  . Nasal Congestion    HPI Donald Gross is a 59 y.o. male.   Presenting today for 4 day hx of fever, chills, congestion, and cough that has now resolved since yesterday. Feeling back to baseline health today but needs clearance to go back to work. Was not yet COVID tested for these sxs. States he only took tylenol prn for his sxs. Denies ongoing fever, cough, congestion, CP, SOB, N/V/D. No hx of allergies or pulmonary dz, smoking. UTD on COVID vaccines.      Past Medical History:  Diagnosis Date  . Bipolar 2 disorder [facility])     Patient Active Problem List   Diagnosis Date Noted  . Bipolar I disorder (HCC) 08/08/2019    Past Surgical History:  Procedure Laterality Date  . APPENDECTOMY         Home Medications    Prior to Admission medications   Medication Sig Start Date End Date Taking? Authorizing Provider  lamoTRIgine (LAMICTAL) 100 MG tablet Take 100 mg by mouth 2 (two) times daily at 10 AM and 5 PM.   Yes [provider]  risperiDONE (RISPERDAL) 2 MG tablet Take 1 tablet (2 mg total) by mouth at bedtime. 08/08/19  Yes Jearld Lesch, NP    Family History Family History  Problem Relation Age of Onset  . Healthy Mother   . Healthy Father     Social History Social History   Tobacco Use  . Smoking status: Current Every Day Smoker    Types: Cigarettes  . Smokeless tobacco: Never Used  Vaping Use  . Vaping Use: Former  . Devices: only used for a week  Substance Use Topics  . Alcohol use: Yes    Alcohol/week: 2.0 standard drinks    Types: 2 Cans of beer per week  . Drug use: Never     Allergies   Patient has no known allergies.   Review of Systems Review of Systems PER HPI    Physical Exam Triage Vital Signs ED Triage Vitals  Enc Vitals Group     BP  12/06/19 0911 (!) 151/77     Pulse Rate 12/06/19 0911 81     Resp 12/06/19 0911 16     Temp 12/06/19 0911 98.4 F (36.9 C)     Temp Source 12/06/19 0911 Oral     SpO2 12/06/19 0911 99 %     Weight --      Height --      Head Circumference --      Peak Flow --      Pain Score 12/06/19 0909 0     Pain Loc --      Pain Edu? --      Excl. in GC? --    No data found.  Updated Vital Signs BP (!) 151/77 (BP Location: Right Arm)   Pulse 81   Temp 98.4 F (36.9 C) (Oral)   Resp 16   SpO2 99%   Visual Acuity Right Eye Distance:   Left Eye Distance:   Bilateral Distance:    Right Eye Near:   Left Eye Near:    Bilateral Near:     Physical Exam Vitals and nursing note reviewed.  Constitutional:      Appearance: Normal appearance.  HENT:  Head: Atraumatic.     Right Ear: Tympanic membrane normal.     Left Ear: Tympanic membrane normal.     Nose: Nose normal.     Mouth/Throat:     Mouth: Mucous membranes are moist.     Pharynx: Oropharynx is clear.  Eyes:     Extraocular Movements: Extraocular movements intact.     Conjunctiva/sclera: Conjunctivae normal.  Cardiovascular:     Rate and Rhythm: Normal rate and regular rhythm.     Pulses: Normal pulses.     Heart sounds: Normal heart sounds.  Pulmonary:     Effort: Pulmonary effort is normal.     Breath sounds: Normal breath sounds. No wheezing or rales.  Abdominal:     General: Bowel sounds are normal. There is no distension.     Palpations: Abdomen is soft.     Tenderness: There is no abdominal tenderness.  Musculoskeletal:        General: Normal range of motion.     Cervical back: Normal range of motion and neck supple.  Skin:    General: Skin is warm and dry.  Neurological:     General: No focal deficit present.     Mental Status: He is oriented to person, place, and time.  Psychiatric:        Mood and Affect: Mood normal.        Thought Content: Thought content normal.        Judgment: Judgment normal.     UC Treatments / Results  Labs (all labs ordered are listed, but only abnormal results are displayed) Labs Reviewed  SARS CORONAVIRUS 2 (TAT 6-24 HRS)    EKG   Radiology No results found.  Procedures Procedures (including critical care time)  Medications Ordered in UC Medications - No data to display  Initial Impression / Assessment and Plan / UC Course  I have reviewed the triage vital signs and the nursing notes.  Pertinent labs & imaging results that were available during my care of the patient were reviewed by me and considered in my medical decision making (see chart for details).     Suspect viral URI, now resolved. Will r/o COVID and work note given stating he's undergoing testing and is to quarantine until results return. Pt aware of protocol and the requirements to go back to work. Continue to monitor for return of sxs.   Final Clinical Impressions(s) / UC Diagnoses   Final diagnoses:  Viral URI with cough   Discharge Instructions   None    ED Prescriptions    None     PDMP not reviewed this encounter.   Particia Nearing, [place] 12/06/19 1053

## 2019-12-06 NOTE — ED Triage Notes (Signed)
Patient states he has been out of work for 4 days to to symptoms of fever, chills, headache, bodyaches and runny nose. Patient states symptoms started on Sunday.Patient denies SOB. Patient believes that symptoms are due to the flu. Patient has been vaccinated for COVID. No recent covid test. Patient states that he needs a work note stating that he is cleared to return to work. Patient denies having symptoms at this time.

## 2019-12-13 ENCOUNTER — Other Ambulatory Visit (HOSPITAL_COMMUNITY): Payer: Self-pay | Admitting: Psychiatry

## 2019-12-13 ENCOUNTER — Telehealth (HOSPITAL_COMMUNITY): Payer: Self-pay | Admitting: Psychiatry

## 2019-12-13 DIAGNOSIS — F319 Bipolar disorder, unspecified: Secondary | ICD-10-CM

## 2019-12-13 MED ORDER — LAMOTRIGINE 100 MG PO TABS: 100.0000 mg | ORAL_TABLET | Freq: Two times a day (BID) | ORAL | 2 refills | Status: DC

## 2019-12-13 NOTE — Telephone Encounter (Signed)
Medications refilled and sent to [facility].

## 2020-01-29 ENCOUNTER — Telehealth (HOSPITAL_COMMUNITY): Payer: No Payment, Other | Admitting: Psychiatry

## 2020-01-29 ENCOUNTER — Encounter (HOSPITAL_COMMUNITY): Payer: Self-pay | Admitting: Psychiatry

## 2020-01-29 ENCOUNTER — Other Ambulatory Visit: Payer: Self-pay

## 2020-03-11 ENCOUNTER — Other Ambulatory Visit (HOSPITAL_COMMUNITY): Payer: Self-pay | Admitting: Psychiatry

## 2020-03-11 ENCOUNTER — Telehealth (HOSPITAL_COMMUNITY): Payer: Self-pay | Admitting: *Deleted

## 2020-03-11 DIAGNOSIS — F319 Bipolar disorder, unspecified: Secondary | ICD-10-CM

## 2020-03-11 MED ORDER — RISPERIDONE 2 MG PO TABS: 2.0000 mg | ORAL_TABLET | Freq: Every day | ORAL | 2 refills | Status: DC

## 2020-03-11 NOTE — Telephone Encounter (Signed)
Call from patient stating he needed a rx for his Risperdal, he has a refill on his Lamictal. Will inform of provider of his need and ask her to call it in to [place].

## 2020-03-11 NOTE — Telephone Encounter (Signed)
Medication refilled. Patient will need to schedule and appointment with a provider for future refills.

## 2020-03-18 ENCOUNTER — Encounter (HOSPITAL_COMMUNITY): Payer: Self-pay | Admitting: Psychiatry

## 2020-03-18 ENCOUNTER — Other Ambulatory Visit: Payer: Self-pay

## 2020-03-18 ENCOUNTER — Telehealth (INDEPENDENT_AMBULATORY_CARE_PROVIDER_SITE_OTHER): Payer: No Payment, Other | Admitting: Psychiatry

## 2020-03-18 DIAGNOSIS — F319 Bipolar disorder, unspecified: Secondary | ICD-10-CM | POA: Diagnosis not present

## 2020-03-18 MED ORDER — RISPERIDONE 2 MG PO TABS: 2.0000 mg | ORAL_TABLET | Freq: Every day | ORAL | 2 refills | Status: DC

## 2020-03-18 MED ORDER — LAMOTRIGINE 100 MG PO TABS: 100.0000 mg | ORAL_TABLET | Freq: Two times a day (BID) | ORAL | 2 refills | Status: DC

## 2020-03-18 NOTE — Progress Notes (Signed)
BH MD/PA/NP OP Progress Note Virtual Visit via Telephone Note  I connected with Donald Gross on 03/18/20 at  4:30 PM EST by telephone and verified that I am speaking with the correct person using two identifiers.  Location: Patient: home Provider: Clinic   I discussed the limitations, risks, security and privacy concerns of performing an evaluation and management service by telephone and the availability of in person appointments. I also discussed with the patient that there may be a patient responsible charge related to this service. The patient expressed understanding and agreed to proceed.   I provided 20 minutes of non-face-to-face time during this encounter.   03/18/2020 1:22 PM Donald Gross  MRN:  [id number]  Chief Complaint: "I feel like these meds are great"  HPI: 60 y/o male seen today for psychiatric follow-up. Patient has a psychiatric history of bipolar disorder. Patient is managed on Lamictal 100 mg twice daily and Risperdal 2mg  once daily. Today patient notes medications have been working great for last few years.   Today patient unable to login virtually so his assessment was done on the phone. During exam he was pleasant, calm, and engaged in conversation. Patient describes their mood to be positive and notes he has minimal anxiety and depression. Provider conducted a PHQ-9 and the patient scored a 2. Provider also conducted a GAD-7 and the patient scored a 3. Today he denied SI/HI/VAH or paranoia.    Patient notes that he has three older children and notes that he is currently not in a relationship. He notes that he was adopted and does not known his biological families medical history. He does inform provider that his adoptive mother lives in . Patient endorses tobacco use -1 pack per day and alcohol use socially.  He notes at this time he does not want to reduce his tobacco consumption.  No medication changes made today. Patient agreeable to continue all  medications as prescribed. No other concerns noted at this time.  Visit Diagnosis:    ICD-10-CM   1. Bipolar I disorder (HCC)  F31.9 lamoTRIgine (LAMICTAL) 100 MG tablet    risperiDONE (RISPERDAL) 2 MG tablet    Past Psychiatric History: Bipolar disorder  Past Medical History:  Past Medical History:  Diagnosis Date  . Bipolar 2 disorder [facility])     Past Surgical History:  Procedure Laterality Date  . APPENDECTOMY      Family Psychiatric History: Patient adopted so he is unaware.   Family History:  Family History  Problem Relation Age of Onset  . Healthy Mother   . Healthy Father     Social History:  Social History   Socioeconomic History  . Marital status: Single    Spouse name: Not on file  . Number of children: Not on file  . Years of education: Not on file  . Highest education level: Not on file  Occupational History  . Not on file  Tobacco Use  . Smoking status: Current Every Day Smoker    Types: Cigarettes  . Smokeless tobacco: Never Used  Vaping Use  . Vaping Use: Former  . Devices: only used for a week  Substance and Sexual Activity  . Alcohol use: Yes    Alcohol/week: 2.0 standard drinks    Types: 2 Cans of beer per week  . Drug use: Never  . Sexual activity: Not on file  Other Topics Concern  . Not on file  Social History Narrative  . Not on file   Social Determinants of  Health   Financial Resource Strain: Not on file  Food Insecurity: Not on file  Transportation Needs: Not on file  Physical Activity: Not on file  Stress: Not on file  Social Connections: Not on file    Allergies: No Known Allergies  Metabolic Disorder Labs: No results found for: HGBA1C, MPG No results found for: PROLACTIN No results found for: CHOL, TRIG, HDL, CHOLHDL, VLDL, LDLCALC No results found for: TSH  Therapeutic Level Labs: No results found for: LITHIUM No results found for: VALPROATE No components found for:  CBMZ  Current Medications: Current  Outpatient Medications  Medication Sig Dispense Refill  . lamoTRIgine (LAMICTAL) 100 MG tablet Take 1 tablet (100 mg total) by mouth 2 (two) times daily at 10 AM and 5 PM. 60 tablet 2  . risperiDONE (RISPERDAL) 2 MG tablet Take 1 tablet (2 mg total) by mouth daily. 30 tablet 2   No current facility-administered medications for this visit.     Musculoskeletal: Strength & Muscle Tone: Unable to assess due to telephone visit Gait & Station: Unable to assess due to telephone visit Patient leans: N/A  Psychiatric Specialty Exam: Review of Systems  There were no vitals taken for this visit.There is no height or weight on file to calculate BMI.  General Appearance: Unable to assess due to telephone visit  Eye Contact:  Unable to assess due to telephone visit  Speech:  Clear and Coherent and Normal Rate  Volume:  Normal  Mood:  Euthymic  Affect:  Appropriate and Congruent  Thought Process:  Coherent, Goal Directed and Linear  Orientation:  Full (Time, Place, and Person)  Thought Content: WDL and Logical   Suicidal Thoughts:  No  Homicidal Thoughts:  No  Memory:  Immediate;   Good Recent;   Good Remote;   Good  Judgement:  Good  Insight:  Good  Psychomotor Activity:  Normal  Concentration:  Concentration: Good and Attention Span: Good  Recall:  Good  Fund of Knowledge: Good  Language: Good  Akathisia:  No  Handed:  Right  AIMS (if indicated): Not done  Assets:  Communication Skills Desire for Improvement Financial Resources/Insurance Housing Physical Health Social Support  ADL's:  Intact  Cognition: WNL  Sleep:  Good   Screenings: GAD-7   Flowsheet Row [facility]  Total GAD-7 Score 3    PHQ2-9   Flowsheet Row Video Visit from 03/18/2020 in [facility]  PHQ-2 Total Score 0  PHQ-9 Total Score 2       Assessment and Plan: Patient notes that he is doing well on current medication  regimen. No medication changes made today. Patient agreeable to continue medications as prescribed.   1. Bipolar I disorder (HCC)  Continue- lamoTRIgine (LAMICTAL) 100 MG tablet; Take 1 tablet (100 mg total) by mouth 2 (two) times daily at 10 AM and 5 PM.  Dispense: 60 tablet; Refill: 2 Continue- risperiDONE (RISPERDAL) 2 MG tablet; Take 1 tablet (2 mg total) by mouth daily.  Dispense: 30 tablet; Refill: 2  Follow up in 3 months   Shanna Cisco, NP 03/18/2020, 1:22 PM

## 2020-06-16 ENCOUNTER — Other Ambulatory Visit: Payer: Self-pay

## 2020-06-16 ENCOUNTER — Telehealth (INDEPENDENT_AMBULATORY_CARE_PROVIDER_SITE_OTHER): Payer: No Payment, Other | Admitting: Psychiatry

## 2020-06-16 ENCOUNTER — Encounter (HOSPITAL_COMMUNITY): Payer: Self-pay | Admitting: Psychiatry

## 2020-06-16 DIAGNOSIS — F319 Bipolar disorder, unspecified: Secondary | ICD-10-CM | POA: Diagnosis not present

## 2020-06-16 MED ORDER — RISPERIDONE 2 MG PO TABS: 2.0000 mg | ORAL_TABLET | Freq: Every day | ORAL | 2 refills | Status: DC

## 2020-06-16 MED ORDER — LAMOTRIGINE 100 MG PO TABS: 100.0000 mg | ORAL_TABLET | Freq: Two times a day (BID) | ORAL | 2 refills | Status: DC

## 2020-06-16 NOTE — Progress Notes (Signed)
BH MD/PA/NP OP Progress Note Virtual Visit via Telephone Note  I connected with Donald Gross on 06/16/20 at  1:00 PM EDT by telephone and verified that I am speaking with the correct person using two identifiers.  Location: Patient: home Provider: Clinic   I discussed the limitations, risks, security and privacy concerns of performing an evaluation and management service by telephone and the availability of in person appointments. I also discussed with the patient that there may be a patient responsible charge related to this service. The patient expressed understanding and agreed to proceed.   I provided 20 minutes of non-face-to-face time during this encounter.   06/16/2020 1:28 PM [name]  MRN:  [id number]  Chief Complaint: "Nothing have change. I am doing well."  HPI: 60 y/o male seen today for psychiatric follow-up. Patient has a psychiatric history of bipolar disorder. Patient is managed on Lamictal 100 mg twice daily and Risperdal 2mg  once daily. Today patient notes medications are effective in managing his psychiatric conditons.   Today patient unable to login virtually so his assessment was done on the phone. During exam he was pleasant, calm, and engaged in conversation.  He informed that nothing has changed since his last visit and reports that he is doing well.  He notes that he has no anxiety or depression.  Provider conducted a GAD-7 and patient scored a 0, at his last visit he scored a 3.  Provider also conducted a PHQ-9 and patient scored a 0, at his last visit he scored a 2.  Today he denied SI/HI/VAH, mania, or paranoia.  He endorses adequate sleep and appetite.   No medication changes made today. Patient agreeable to continue all medications as prescribed. No other concerns noted at this time.  Visit Diagnosis:    ICD-10-CM   1. Bipolar I disorder (HCC)  F31.9 lamoTRIgine (LAMICTAL) 100 MG tablet    risperiDONE (RISPERDAL) 2 MG tablet    Past  Psychiatric History: Bipolar disorder  Past Medical History:  Past Medical History:  Diagnosis Date  . Bipolar 2 disorder [facility])     Past Surgical History:  Procedure Laterality Date  . APPENDECTOMY      Family Psychiatric History: Patient adopted so he is unaware.   Family History:  Family History  Problem Relation Age of Onset  . Healthy Mother   . Healthy Father     Social History:  Social History   Socioeconomic History  . Marital status: Single    Spouse name: Not on file  . Number of children: Not on file  . Years of education: Not on file  . Highest education level: Not on file  Occupational History  . Not on file  Tobacco Use  . Smoking status: Current Every Day Smoker    Types: Cigarettes  . Smokeless tobacco: Never Used  Vaping Use  . Vaping Use: Former  . Devices: only used for a week  Substance and Sexual Activity  . Alcohol use: Yes    Alcohol/week: 2.0 standard drinks    Types: 2 Cans of beer per week  . Drug use: Never  . Sexual activity: Not on file  Other Topics Concern  . Not on file  Social History Narrative  . Not on file   Social Determinants of Health   Financial Resource Strain: Not on file  Food Insecurity: Not on file  Transportation Needs: Not on file  Physical Activity: Not on file  Stress: Not on file  Social Connections:  Not on file    Allergies: No Known Allergies  Metabolic Disorder Labs: No results found for: HGBA1C, MPG No results found for: PROLACTIN No results found for: CHOL, TRIG, HDL, CHOLHDL, VLDL, LDLCALC No results found for: TSH  Therapeutic Level Labs: No results found for: LITHIUM No results found for: VALPROATE No components found for:  CBMZ  Current Medications: Current Outpatient Medications  Medication Sig Dispense Refill  . lamoTRIgine (LAMICTAL) 100 MG tablet Take 1 tablet (100 mg total) by mouth 2 (two) times daily at 10 AM and 5 PM. 60 tablet 2  . risperiDONE (RISPERDAL) 2 MG tablet Take 1  tablet (2 mg total) by mouth daily. 30 tablet 2   No current facility-administered medications for this visit.     Musculoskeletal: Strength & Muscle Tone: Unable to assess due to telephone visit Gait & Station: Unable to assess due to telephone visit Patient leans: N/A  Psychiatric Specialty Exam: Review of Systems  There were no vitals taken for this visit.There is no height or weight on file to calculate BMI.  General Appearance: Unable to assess due to telephone visit  Eye Contact:  Unable to assess due to telephone visit  Speech:  Clear and Coherent and Normal Rate  Volume:  Normal  Mood:  Euthymic  Affect:  Appropriate and Congruent  Thought Process:  Coherent, Goal Directed and Linear  Orientation:  Full (Time, Place, and Person)  Thought Content: WDL and Logical   Suicidal Thoughts:  No  Homicidal Thoughts:  No  Memory:  Immediate;   Good Recent;   Good Remote;   Good  Judgement:  Good  Insight:  Good  Psychomotor Activity:  Normal  Concentration:  Concentration: Good and Attention Span: Good  Recall:  Good  Fund of Knowledge: Good  Language: Good  Akathisia:  No  Handed:  Right  AIMS (if indicated): Not done  Assets:  Communication Skills Desire for Improvement Financial Resources/Insurance Housing Physical Health Social Support  ADL's:  Intact  Cognition: WNL  Sleep:  Good   Screenings: GAD-7   Flowsheet Row Video Visit [facility] 03/18/2020 in [facility]  Total GAD-7 Score 3    PHQ2-9   Flowsheet Row Video Visit from 06/16/2020 in [facility] Video Visit from 03/18/2020 in [facility]  PHQ-2 Total Score 0 0  PHQ-9 Total Score 0 2    Flowsheet Row Video Visit from 06/16/2020 in [facility]  C-SSRS RISK CATEGORY No Risk       Assessment and Plan: Patient notes that he is doing well on current medication regimen. No medication changes made  today. Patient agreeable to continue medications as prescribed.   1. Bipolar I disorder (HCC)  Continue- lamoTRIgine (LAMICTAL) 100 MG tablet; Take 1 tablet (100 mg total) by mouth 2 (two) times daily at 10 AM and 5 PM.  Dispense: 60 tablet; Refill: 2 Continue- risperiDONE (RISPERDAL) 2 MG tablet; Take 1 tablet (2 mg total) by mouth daily.  Dispense: 30 tablet; Refill: 2  Follow up in 3 months   Shanna Cisco, NP 06/16/2020, 1:28 PM

## 2020-08-13 ENCOUNTER — Other Ambulatory Visit: Payer: Self-pay

## 2020-08-13 ENCOUNTER — Telehealth (HOSPITAL_COMMUNITY): Payer: No Payment, Other | Admitting: Psychiatry

## 2020-09-05 ENCOUNTER — Encounter (HOSPITAL_COMMUNITY): Payer: Self-pay | Admitting: Psychiatry

## 2020-09-05 ENCOUNTER — Telehealth (INDEPENDENT_AMBULATORY_CARE_PROVIDER_SITE_OTHER): Payer: No Payment, Other | Admitting: Psychiatry

## 2020-09-05 DIAGNOSIS — F319 Bipolar disorder, unspecified: Secondary | ICD-10-CM | POA: Diagnosis not present

## 2020-09-05 MED ORDER — LAMOTRIGINE 100 MG PO TABS
100.0000 mg | ORAL_TABLET | Freq: Two times a day (BID) | ORAL | 2 refills | Status: DC
Start: 2020-09-05 — End: 2020-12-05

## 2020-09-05 MED ORDER — RISPERIDONE 2 MG PO TABS: 2.0000 mg | ORAL_TABLET | Freq: Every day | ORAL | 2 refills | Status: DC

## 2020-09-05 NOTE — Progress Notes (Signed)
BH MD/PA/NP OP Progress Note Virtual Visit via Telephone Note  I connected with Donald Gross on 09/05/20 at 10:30 AM EDT by telephone and verified that I am speaking with the correct person using two identifiers.  Location: Patient: home Provider: Clinic   I discussed the limitations, risks, security and privacy concerns of performing an evaluation and management service by telephone and the availability of in person appointments. I also discussed with the patient that there may be a patient responsible charge related to this service. The patient expressed understanding and agreed to proceed.   I provided 20 minutes of non-face-to-face time during this encounter.   09/05/2020 10:00 AM Donald Gross  MRN:  [id number]  Chief Complaint: "I am doing well."  HPI: 60 y/o male seen today for follow-up psychiatric evaluation. He has a psychiatric history of bipolar disorder. Patient is managed on Lamictal 100 mg twice daily and Risperdal 2mg  once daily. Today patient notes medications are effective in managing his psychiatric conditons.   Today patient unable to login virtually so his assessment was done on the phone. During exam he was pleasant, calm, and engaged in conversation.  He informed overall he is doing well.  He notes that he is working today and plans to go to the pool with some friends to celebrate the 4th of July. He informed August that his anxiety and depression continues to be minimal.  Provider conducted a GAD-7 and patient scored a 2, at his last visit he scored a 0.  Provider also conducted a PHQ-9 and patient scored a 2, at his last visit he scored a 0.  Today he denied SI/HI/VAH, mania, or paranoia.  He endorses adequate sleep and appetite.   No medication changes made today. Patient agreeable to continue all medications as prescribed. No other concerns noted at this time.  Visit Diagnosis:    ICD-10-CM   1. Bipolar I disorder (HCC)  F31.9 lamoTRIgine (LAMICTAL) 100  MG tablet    risperiDONE (RISPERDAL) 2 MG tablet      Past Psychiatric History: Bipolar disorder  Past Medical History:  Past Medical History:  Diagnosis Date   Bipolar 2 disorder (HCC)     Past Surgical History:  Procedure Laterality Date   APPENDECTOMY      Family Psychiatric History: Patient adopted so he is unaware.   Family History:  Family History  Problem Relation Age of Onset   Healthy Mother    Healthy Father     Social History:  Social History   Socioeconomic History   Marital status: Single    Spouse name: Not on file   Number of children: Not on file   Years of education: Not on file   Highest education level: Not on file  Occupational History   Not on file  Tobacco Use   Smoking status: Every Day    Pack years: 0.00    Types: Cigarettes   Smokeless tobacco: Never  Vaping Use   Vaping Use: Former   Devices: only used for a week  Substance and Sexual Activity   Alcohol use: Yes    Alcohol/week: 2.0 standard drinks    Types: 2 Cans of beer per week   Drug use: Never   Sexual activity: Not on file  Other Topics Concern   Not on file  Social History Narrative   Not on file   Social Determinants of Health   Financial Resource Strain: Not on file  Food Insecurity: Not on file  Transportation  Needs: Not on file  Physical Activity: Not on file  Stress: Not on file  Social Connections: Not on file    Allergies: No Known Allergies  Metabolic Disorder Labs: No results found for: HGBA1C, MPG No results found for: PROLACTIN No results found for: CHOL, TRIG, HDL, CHOLHDL, VLDL, LDLCALC No results found for: TSH  Therapeutic Level Labs: No results found for: LITHIUM No results found for: VALPROATE No components found for:  CBMZ  Current Medications: Current Outpatient Medications  Medication Sig Dispense Refill   lamoTRIgine (LAMICTAL) 100 MG tablet Take 1 tablet (100 mg total) by mouth 2 (two) times daily at 10 AM and 5 PM. 60 tablet 2    risperiDONE (RISPERDAL) 2 MG tablet Take 1 tablet (2 mg total) by mouth daily. 30 tablet 2   No current facility-administered medications for this visit.     Musculoskeletal: Strength & Muscle Tone:  Unable to assess due to telephone visit Gait & Station:  Unable to assess due to telephone visit Patient leans: N/A  Psychiatric Specialty Exam: Review of Systems  There were no vitals taken for this visit.There is no height or weight on file to calculate BMI.  General Appearance:  Unable to assess due to telephone visit  Eye Contact:   Unable to assess due to telephone visit  Speech:  Clear and Coherent and Normal Rate  Volume:  Normal  Mood:  Euthymic  Affect:  Appropriate and Congruent  Thought Process:  Coherent, Goal Directed and Linear  Orientation:  Full (Time, Place, and Person)  Thought Content: WDL and Logical   Suicidal Thoughts:  No  Homicidal Thoughts:  No  Memory:  Immediate;   Good Recent;   Good Remote;   Good  Judgement:  Good  Insight:  Good  Psychomotor Activity:  Normal  Concentration:  Concentration: Good and Attention Span: Good  Recall:  Good  Fund of Knowledge: Good  Language: Good  Akathisia:  No  Handed:  Right  AIMS (if indicated): Not done  Assets:  Communication Skills Desire for Improvement Financial Resources/Insurance Housing Physical Health Social Support  ADL's:  Intact  Cognition: WNL  Sleep:  Good   Screenings: GAD-7    Flowsheet Row Video Visit from 09/05/2020 in [facility] Video Visit from 03/18/2020 in [facility]  Total GAD-7 Score 2 3      PHQ2-9    Flowsheet Row [facility] Video Visit from 06/16/2020 in [facility] Video Visit from 03/18/2020 in [facility]  PHQ-2 Total Score 0 0 0  PHQ-9 Total Score 1 0 2      Flowsheet Row Video Visit from  06/16/2020 in [facility]  C-SSRS RISK CATEGORY No Risk        Assessment and Plan: Patient notes that he is doing well on current medication regimen. No medication changes made today. Patient agreeable to continue medications as prescribed.   1. Bipolar I disorder (HCC)  Continue- lamoTRIgine (LAMICTAL) 100 MG tablet; Take 1 tablet (100 mg total) by mouth 2 (two) times daily at 10 AM and 5 PM.  Dispense: 60 tablet; Refill: 2 Continue- risperiDONE (RISPERDAL) 2 MG tablet; Take 1 tablet (2 mg total) by mouth daily.  Dispense: 30 tablet; Refill: 2  Follow up in 3 months   Shanna Cisco, NP 09/05/2020, 10:00 AM

## 2020-12-05 ENCOUNTER — Encounter (HOSPITAL_COMMUNITY): Payer: Self-pay | Admitting: Psychiatry

## 2020-12-05 ENCOUNTER — Other Ambulatory Visit: Payer: Self-pay

## 2020-12-05 ENCOUNTER — Telehealth (INDEPENDENT_AMBULATORY_CARE_PROVIDER_SITE_OTHER): Payer: No Payment, Other | Admitting: Psychiatry

## 2020-12-05 DIAGNOSIS — F319 Bipolar disorder, unspecified: Secondary | ICD-10-CM | POA: Diagnosis not present

## 2020-12-05 MED ORDER — LAMOTRIGINE 100 MG PO TABS: 100.0000 mg | ORAL_TABLET | Freq: Two times a day (BID) | ORAL | 3 refills | Status: AC

## 2020-12-05 MED ORDER — RISPERIDONE 2 MG PO TABS: 2.0000 mg | ORAL_TABLET | Freq: Every day | ORAL | 3 refills | Status: AC

## 2020-12-05 NOTE — Progress Notes (Signed)
BH MD/PA/NP OP Progress Note Virtual Visit via Video Note  I connected with Donald Gross on 12/05/20 at 10:00 AM EDT by a video enabled telemedicine application and verified that I am speaking with the correct person using two identifiers.  Location: Patient: Home Provider: Clinic   I discussed the limitations of evaluation and management by telemedicine and the availability of in person appointments. The patient expressed understanding and agreed to proceed.  I provided 30 minutes of non-face-to-face time during this encounter.     12/05/2020 10:08 AM Donald Gross  MRN:  [id number]  Chief Complaint: "I am helping my sister with my mother."  HPI: 60 y/o male seen today for follow-up psychiatric evaluation. He has a psychiatric history of bipolar disorder. Patient is managed on Lamictal 100 mg twice daily and Risperdal 2mg  once daily. Today patient notes medications are effective in managing his psychiatric conditons.   Today patient was  well groomed, pleasant, calm, engaged in conversation, and maintained eye contact.  He informed overall he is doing well. 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 He notes that his mother is currently hospitalized as she will be having surgery on her heart soon. He however notes that he remains mentally stable and notes that he has minimal anxiety and depression.  Provider conducted a GAD-7 and patient scored a 2, at his last visit he scored a 02  Provider also conducted a PHQ-9 and patient scored a 1, at his last visit he scored a 2.  Today he denied SI/HI/VAH, mania, or paranoia.  He endorses adequate sleep and appetite.   No medication changes made today. Patient agreeable to continue all medications as prescribed. No other concerns noted at this time.  Visit Diagnosis:    ICD-10-CM   1. Bipolar I disorder (HCC)  F31.9 lamoTRIgine (LAMICTAL) 100 MG tablet    risperiDONE (RISPERDAL) 2 MG tablet       Past Psychiatric History: Bipolar disorder  Past Medical History:  Past Medical History:  Diagnosis Date   Bipolar 2 disorder (HCC)     Past Surgical History:  Procedure Laterality Date   APPENDECTOMY      Family Psychiatric History: Patient adopted so he is unaware.   Family History:  Family History  Problem Relation Age of Onset   Healthy Mother    Healthy Father     Social History:  Social History   Socioeconomic History   Marital status: Single    Spouse name: Not on file   Number of children: Not on file   Years of education: Not on file   Highest education level: Not on file  Occupational History   Not on file  Tobacco Use   Smoking status: Every Day    Types: Cigarettes   Smokeless tobacco: Never  Vaping Use   Vaping Use: Former   Devices: only used for a week  Substance and Sexual Activity   Alcohol use: Yes    Alcohol/week: 2.0 standard drinks    Types: 2 Cans of beer per week   Drug use: Never   Sexual activity: Not on file  Other Topics Concern   Not on file  Social History Narrative   Not on file   Social Determinants of Health   Financial Resource Strain: Not on file  Food Insecurity: Not on file  Transportation Needs: Not on file  Physical Activity: Not on file  Stress: Not on file  Social Connections: Not  on file    Allergies: No Known Allergies  Metabolic Disorder Labs: No results found for: HGBA1C, MPG No results found for: PROLACTIN No results found for: CHOL, TRIG, HDL, CHOLHDL, VLDL, LDLCALC No results found for: TSH  Therapeutic Level Labs: No results found for: LITHIUM No results found for: VALPROATE No components found for:  CBMZ  Current Medications: Current Outpatient Medications  Medication Sig Dispense Refill   lamoTRIgine (LAMICTAL) 100 MG tablet Take 1 tablet (100 mg total) by mouth 2 (two) times daily at 10 AM and 5 PM. 60 tablet 3   risperiDONE (RISPERDAL) 2 MG tablet Take 1 tablet (2 mg total) by mouth  daily. 30 tablet 3   No current facility-administered medications for this visit.     Musculoskeletal: Strength & Muscle Tone:  Unable to assess due to telehealth visit Gait & Station:  Unable to assess due to telehealth visit Patient leans: N/A  Psychiatric Specialty Exam: Review of Systems  There were no vitals taken for this visit.There is no height or weight on file to calculate BMI.  General Appearance: Well Groomed  Eye Contact:  Good  Speech:  Clear and Coherent and Normal Rate  Volume:  Normal  Mood:  Euthymic  Affect:  Appropriate and Congruent  Thought Process:  Coherent, Goal Directed and Linear  Orientation:  Full (Time, Place, and Person)  Thought Content: WDL and Logical   Suicidal Thoughts:  No  Homicidal Thoughts:  No  Memory:  Immediate;   Good Recent;   Good Remote;   Good  Judgement:  Good  Insight:  Good  Psychomotor Activity:  Normal  Concentration:  Concentration: Good and Attention Span: Good  Recall:  Good  Fund of Knowledge: Good  Language: Good  Akathisia:  No  Handed:  Right  AIMS (if indicated): Not done  Assets:  Communication Skills Desire for Improvement Financial Resources/Insurance Housing Physical Health Social Support  ADL's:  Intact  Cognition: WNL  Sleep:  Good   Screenings: GAD-7    Flowsheet Row Video Visit from 12/05/2020 in [facility] Video Visit from 09/05/2020 in [facility] Video Visit from 03/18/2020 in [facility]  Total GAD-7 Score 2 2 3       PHQ2-9    Flowsheet Row Video Visit from 12/05/2020 in [facility] Video Visit from 09/05/2020 in [facility] Video Visit from 06/16/2020 in [facility] Video Visit from 03/18/2020 in [facility]  PHQ-2 Total Score 0 0 0 0  PHQ-9 Total Score 1 1 0 2      Flowsheet Row Video  Visit from 06/16/2020 in [facility]  C-SSRS RISK CATEGORY No Risk        Assessment and Plan: Patient notes that he is doing well on current medication regimen. No medication changes made today. Patient agreeable to continue medications as prescribed.   1. Bipolar I disorder (HCC)  Continue- lamoTRIgine (LAMICTAL) 100 MG tablet; Take 1 tablet (100 mg total) by mouth 2 (two) times daily at 10 AM and 5 PM.  Dispense: 60 tablet; Refill: 3 Continue- risperiDONE (RISPERDAL) 2 MG tablet; Take 1 tablet (2 mg total) by mouth daily.  Dispense: 30 tablet; Refill: 3  Follow up in 3 months   [facility], NP 12/05/2020, 10:08 AM

## 2021-02-13 ENCOUNTER — Telehealth (HOSPITAL_COMMUNITY): Payer: No Payment, Other | Admitting: Psychiatry

## 2021-02-19 ENCOUNTER — Telehealth (HOSPITAL_COMMUNITY): Payer: No Payment, Other | Admitting: Psychiatry
